# Patient Record
Sex: Female | Born: 1965 | Hispanic: Yes | Marital: Married | Smoking: Never smoker
Health system: Southern US, Community
[De-identification: ages and names within clinical notes are randomized; demographics above are authoritative.]

---

## 2012-09-25 ENCOUNTER — Other Ambulatory Visit (HOSPITAL_COMMUNITY): Payer: Self-pay | Admitting: Physician Assistant

## 2012-09-25 DIAGNOSIS — Z139 Encounter for screening, unspecified: Secondary | ICD-10-CM

## 2012-10-02 ENCOUNTER — Ambulatory Visit (HOSPITAL_COMMUNITY)
Admission: RE | Admit: 2012-10-02 | Discharge: 2012-10-02 | Disposition: A | Discharge status: Home / Self Care | Payer: Self-pay | Source: Ambulatory Visit | Attending: Physician Assistant | Admitting: Physician Assistant

## 2012-10-02 DIAGNOSIS — Z139 Encounter for screening, unspecified: Secondary | ICD-10-CM

## 2016-04-25 ENCOUNTER — Emergency Department (HOSPITAL_COMMUNITY): Payer: Worker's Compensation

## 2016-04-25 ENCOUNTER — Encounter (HOSPITAL_COMMUNITY): Payer: Self-pay

## 2016-04-25 ENCOUNTER — Emergency Department (HOSPITAL_COMMUNITY)
Admission: EM | Admit: 2016-04-25 | Discharge: 2016-04-25 | Disposition: A | Discharge status: Home / Self Care | Payer: Worker's Compensation | Attending: Emergency Medicine | Admitting: Emergency Medicine

## 2016-04-25 DIAGNOSIS — S62521A Displaced fracture of distal phalanx of right thumb, initial encounter for closed fracture: Secondary | ICD-10-CM | POA: Insufficient documentation

## 2016-04-25 DIAGNOSIS — S62639B Displaced fracture of distal phalanx of unspecified finger, initial encounter for open fracture: Secondary | ICD-10-CM

## 2016-04-25 DIAGNOSIS — Z79899 Other long term (current) drug therapy: Secondary | ICD-10-CM | POA: Diagnosis not present

## 2016-04-25 DIAGNOSIS — S6991XA Unspecified injury of right wrist, hand and finger(s), initial encounter: Secondary | ICD-10-CM | POA: Diagnosis present

## 2016-04-25 DIAGNOSIS — Y939 Activity, unspecified: Secondary | ICD-10-CM | POA: Diagnosis not present

## 2016-04-25 DIAGNOSIS — W230XXA Caught, crushed, jammed, or pinched between moving objects, initial encounter: Secondary | ICD-10-CM | POA: Diagnosis not present

## 2016-04-25 DIAGNOSIS — Y990 Civilian activity done for income or pay: Secondary | ICD-10-CM | POA: Insufficient documentation

## 2016-04-25 DIAGNOSIS — Y929 Unspecified place or not applicable: Secondary | ICD-10-CM | POA: Diagnosis not present

## 2016-04-25 LAB — I-STAT CHEM 8, ED
BUN: 12 mg/dL (ref 6–20)
Calcium, Ion: 1.13 mmol/L (ref 1.13–1.30)
Chloride: 103 mmol/L (ref 101–111)
Creatinine, Ser: 0.5 mg/dL (ref 0.44–1.00)
Glucose, Bld: 108 mg/dL — ABNORMAL HIGH (ref 65–99)
HEMATOCRIT: 42 % (ref 36.0–46.0)
HEMOGLOBIN: 14.3 g/dL (ref 12.0–15.0)
Potassium: 3.6 mmol/L (ref 3.5–5.1)
SODIUM: 143 mmol/L (ref 135–145)
TCO2: 27 mmol/L (ref 0–100)

## 2016-04-25 MED ORDER — ONDANSETRON 4 MG PO TBDP
4.0000 mg | ORAL_TABLET | Freq: Once | ORAL | Status: AC
  Administered 2016-04-25: 4 mg via ORAL
  Filled 2016-04-25: qty 1

## 2016-04-25 MED ORDER — HYDROCODONE-ACETAMINOPHEN 5-325 MG PO TABS: 1.0000 | ORAL_TABLET | ORAL | 0 refills | Status: DC | PRN

## 2016-04-25 MED ORDER — HYDROCODONE-ACETAMINOPHEN 5-325 MG PO TABS: 1.0000 | ORAL_TABLET | ORAL | 0 refills | Status: AC | PRN

## 2016-04-25 MED ORDER — ONDANSETRON HCL 4 MG PO TABS: 4.0000 mg | ORAL_TABLET | Freq: Four times a day (QID) | ORAL | 0 refills | Status: AC

## 2016-04-25 MED ORDER — HYDROCODONE-ACETAMINOPHEN 5-325 MG PO TABS
1.0000 | ORAL_TABLET | Freq: Once | ORAL | Status: AC
  Administered 2016-04-25: 1 via ORAL
  Filled 2016-04-25: qty 1

## 2016-04-25 MED ORDER — ONDANSETRON HCL 4 MG PO TABS: 4.0000 mg | ORAL_TABLET | Freq: Four times a day (QID) | ORAL | 0 refills | Status: DC

## 2016-04-25 NOTE — ED Triage Notes (Signed)
Onset 04-21-16 pt was at work and right hand got caught in a machine, [facility] [place].  Sutures placed, pt prescribed antibiotics and Oxycodone and told to take Ibuprofen 600 mg tabs.   Pt is not able to keep the Oxycodone down, vomits.  Pt was to f/u with orthopedic on 04-22-16 but did not have a way to get there.  Son brought pt down to [place] to stay with him.  Pt here for f/u for sutures and pain control.

## 2016-04-25 NOTE — ED Notes (Signed)
Taken to xray at this time. 

## 2016-04-25 NOTE — ED Provider Notes (Signed)
[facility]-EMER[facility]ENCY DEPT Provider Note   CSN: [id number] Arrival date & time: 04/25/16  1543     History   Chief Complaint Chief Complaint  Patient presents with  . Hand Injury    HPI Adrienne Conway is a 50 y.o. female.  HPI Pt was injured while at work.  She broke her thumb.  Pt was seen at another facility In [place] and was sutured and referred to an orthopedic doctor.  She has not been able to see the doctor yet.  She was supposed to see a doctor in [place] where she lives but her son brought her down to [place] to live with her while she is recovering.  Pt has been having trouble with nausea and vomiting with the pain medication.  She has vomited 5 times today.  It is always after she takes the medication.  History reviewed. No pertinent past medical history.  There are no active problems to display for this patient.   History reviewed. No pertinent surgical history.  OB History    No data available       Home Medications    Prior to Admission medications   Medication Sig Start Date End Date Taking? Authorizing Provider  cephALEXin (KEFLEX) 500 MG capsule Take 500 mg by mouth 3 (three) times daily. 10 day course started 04/23/16 pm   Yes Historical Provider, MD  HYDROcodone-acetaminophen (NORCO/VICODIN) 5-325 MG tablet Take 1 tablet by mouth every 4 (four) hours as needed. 04/25/16   Linwood Dibbles, MD  ondansetron (ZOFRAN) 4 MG tablet Take 1 tablet (4 mg total) by mouth every 6 (six) hours. 04/25/16   Linwood Dibbles, MD    Family History History reviewed. No pertinent family history.  Social History Social History  Substance Use Topics  . Smoking status: Never Smoker  . Smokeless tobacco: Never Used  . Alcohol use No     Allergies   Review of patient's allergies indicates no known allergies.   Review of Systems Review of Systems  All other systems reviewed and are negative.    Physical Exam Updated Vital Signs BP 104/71 (BP Location: Right Arm)   Pulse 75    Temp 98.4 F (36.9 C) (Oral)   Resp 17   SpO2 100%   Physical Exam  Constitutional: She appears well-developed and well-nourished. No distress.  HENT:  Head: Normocephalic and atraumatic.  Right Ear: External ear normal.  Left Ear: External ear normal.  Eyes: Conjunctivae are normal. Right eye exhibits no discharge. Left eye exhibits no discharge. No scleral icterus.  Neck: Neck supple. No tracheal deviation present.  Cardiovascular: Normal rate.   Pulmonary/Chest: Effort normal. No stridor. No respiratory distress.  Musculoskeletal: She exhibits no edema.  The dressing was removed from the patient's right thumb, she has sutures on the distal aspect of her thumb, the patient's nail was removed and she has a piece of metallic suture cover that's functioning as a splint for the nailbed, no purulent drainage  Neurological: She is alert. Cranial nerve deficit: no gross deficits.  Skin: Skin is warm and dry. No rash noted.  Psychiatric: She has a normal mood and affect.  Nursing note and vitals reviewed.    ED Treatments / Results  Labs (all labs ordered are listed, but only abnormal results are displayed) Labs Reviewed  I-STAT CHEM 8, ED - Abnormal; Notable for the following:       Result Value   Glucose, Bld 108 (*)    All other components within normal  limits    EKG  EKG Interpretation None       Radiology Dg Finger Thumb Right  Result Date: 04/25/2016 CLINICAL DATA:  Laceration 5 days ago at work. EXAM: RIGHT THUMB 2+V COMPARISON:  None. FINDINGS: There is a moderately comminuted tuft fracture of the first distal phalanx with moderate separation of the fracture fragments. The IP joint is intact. No dislocation. Surrounding soft tissues appear disrupted but no metallic foreign body is evident. Detail is mildly obscured by superimposed bandage material. IMPRESSION: Comminuted distal phalangeal tuft fracture.  No dislocation. Electronically Signed   By: Ellery Plunkaniel R Mitchell  M.D.   On: 04/25/2016 21:17    Procedures Procedures (including critical care time)  Medications Ordered in ED Medications  ondansetron (ZOFRAN-ODT) disintegrating tablet 4 mg (4 mg Oral Given 04/25/16 2007)  HYDROcodone-acetaminophen (NORCO/VICODIN) 5-325 MG per tablet 1 tablet (1 tablet Oral Given 04/25/16 2007)     Initial Impression / Assessment and Plan / ED Course  I have reviewed the triage vital signs and the nursing notes.  Pertinent labs & imaging results that were available during my care of the patient were reviewed by me and considered in my medical decision making (see chart for details).  Clinical Course  Comment By Time  Pt has a comminuted distal tuft fracture.  Linwood DibblesJon Hannan Tetzlaff, MD 08/27 2136  No signs of significant olecranon abnormalities. The x-ray confirms a comminuted tuft fracture. Linwood DibblesJon Caydence Enck, MD 08/27 2137    Discussed with Dr Merlyn LotKuzma.  Will have patient follow in the office.  Call tomorrow to schedule  Final Clinical Impressions(s) / ED Diagnoses   Final diagnoses:  Open fracture of tuft of distal phalanx of finger, initial encounter    New Prescriptions New Prescriptions   HYDROCODONE-ACETAMINOPHEN (NORCO/VICODIN) 5-325 MG TABLET    Take 1 tablet by mouth every 4 (four) hours as needed.   ONDANSETRON (ZOFRAN) 4 MG TABLET    Take 1 tablet (4 mg total) by mouth every 6 (six) hours.     Linwood DibblesJon Raynelle Fujikawa, MD 04/25/16 2202

## 2017-03-22 IMAGING — DX DG FINGER THUMB 2+V*R*
3 series · 3 of 3 positions shown · non-contrast
Comparison: None.

CLINICAL DATA: Laceration 5 days ago at work.

EXAM:
RIGHT THUMB 2+V

[x finger pa right]
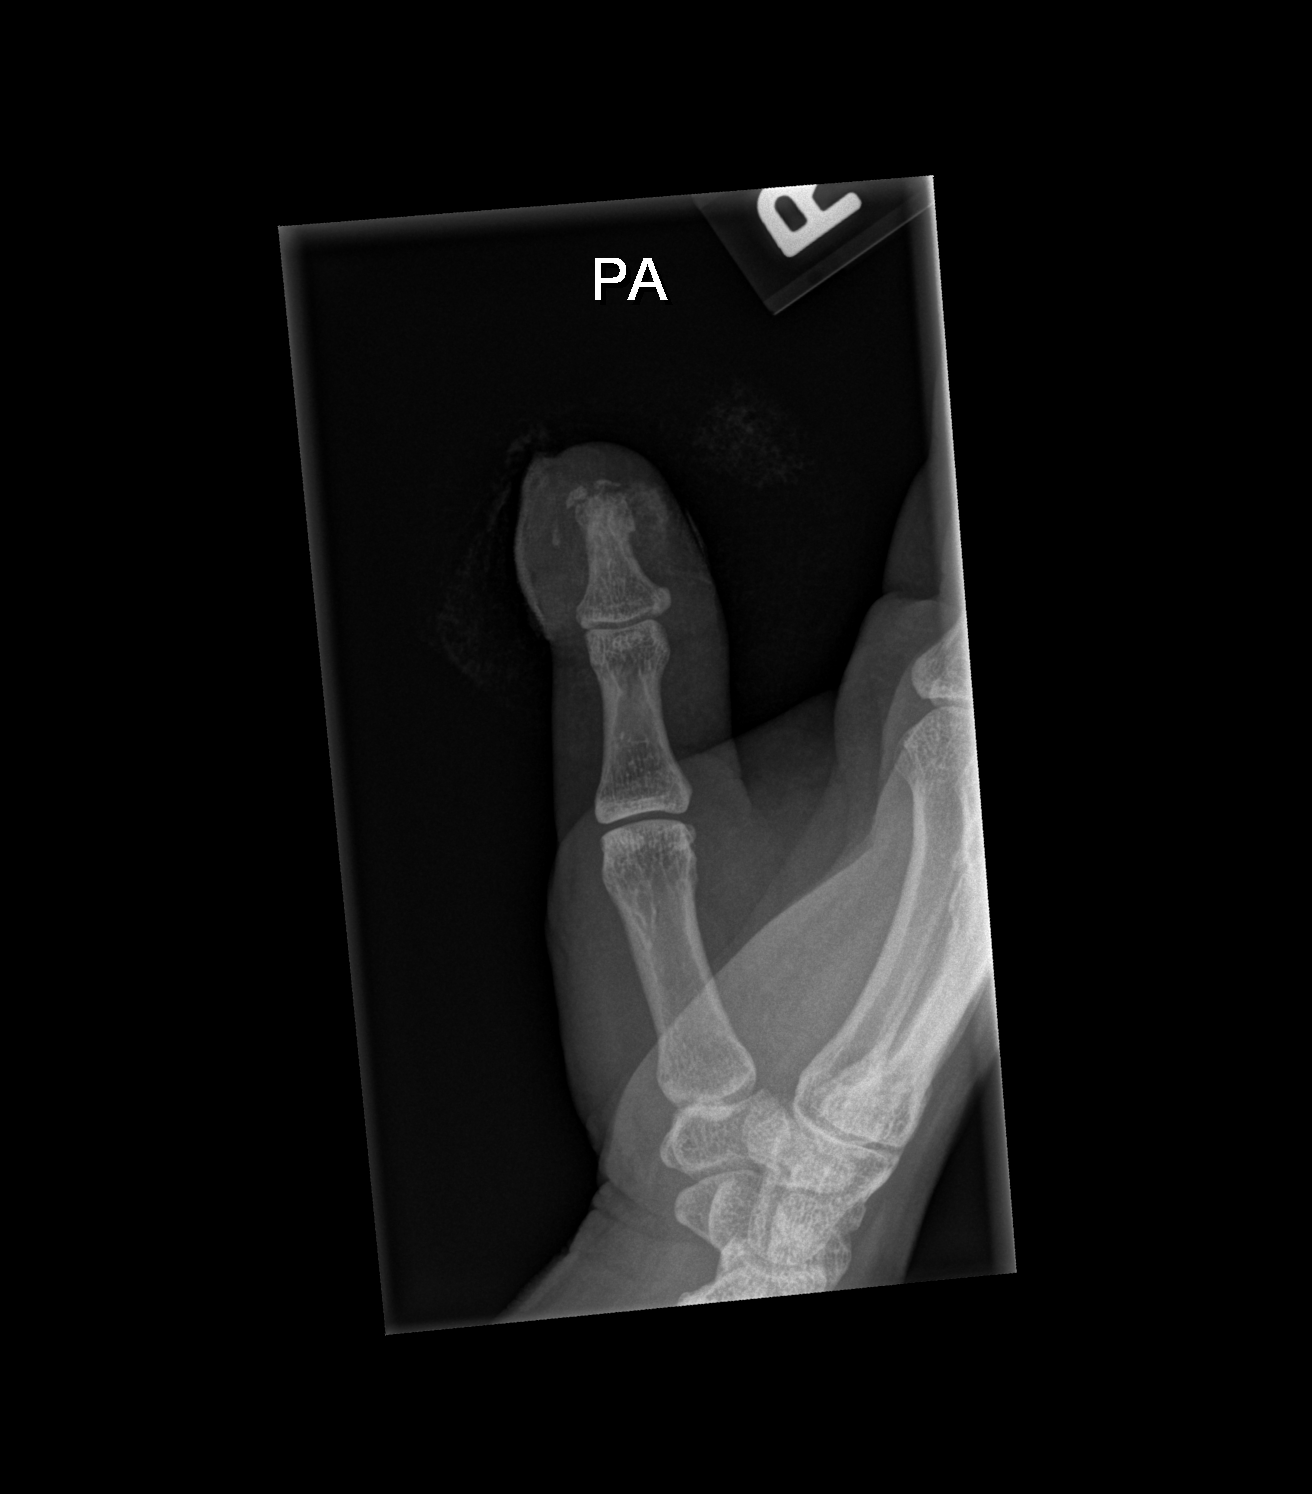

[x finger obl right]
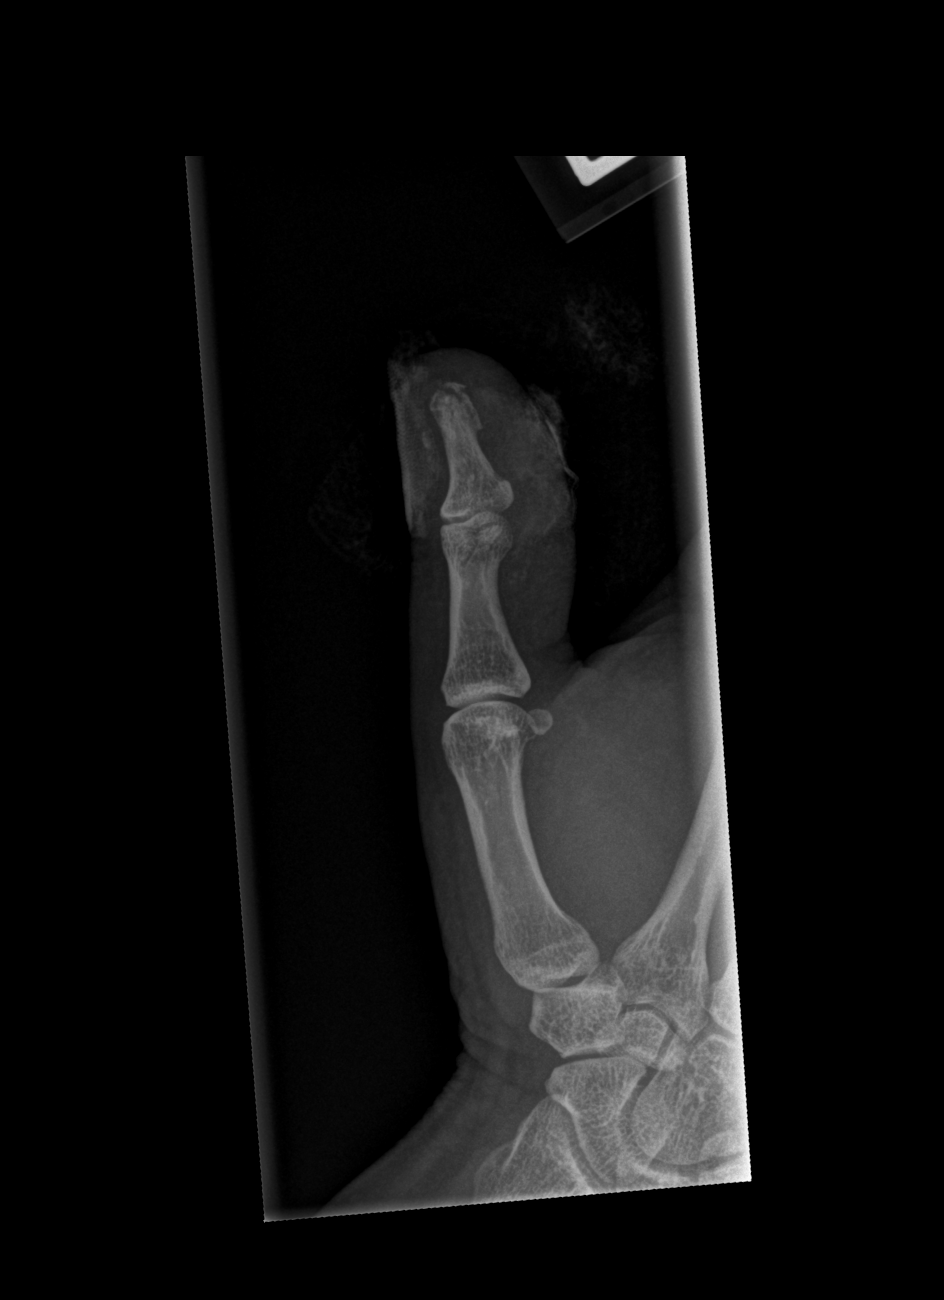

[x finger lat right]
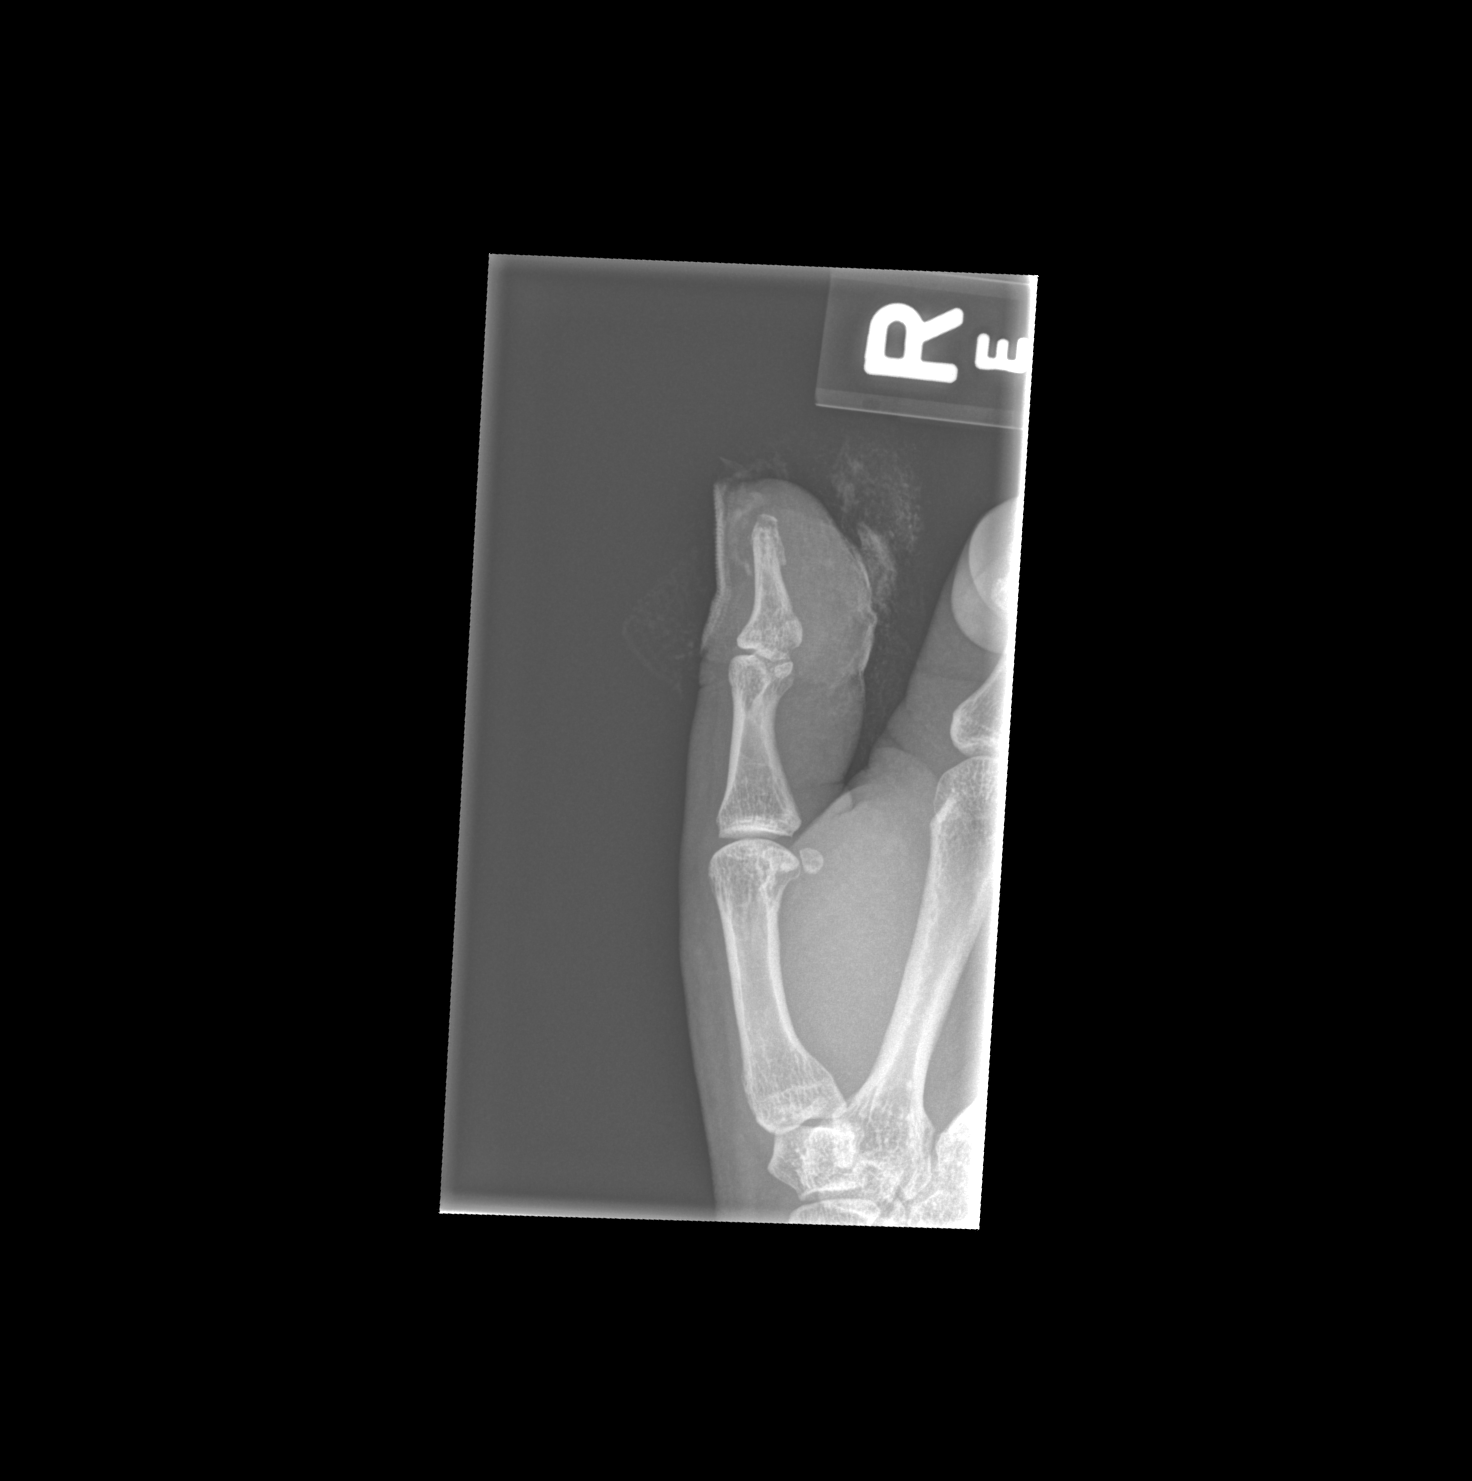

[3 of 3 positions shown; findings below may reference images not displayed]

FINDINGS: There is a moderately comminuted tuft fracture of the first distal
phalanx with moderate separation of the fracture fragments. The IP
joint is intact. No dislocation. Surrounding soft tissues appear
disrupted but no metallic foreign body is evident. Detail is mildly
obscured by superimposed bandage material.
IMPRESSION: Comminuted distal phalangeal tuft fracture.  No dislocation.
